# Patient Record
Sex: Male | Born: 1965 | Race: White | Hispanic: No | Marital: Married | Smoking: Current every day smoker
Health system: Southern US, Community
[De-identification: ages and names within clinical notes are randomized; demographics above are authoritative.]

## PROBLEM LIST (undated history)

## (undated) DIAGNOSIS — J449 Chronic obstructive pulmonary disease, unspecified: Secondary | ICD-10-CM

## (undated) DIAGNOSIS — I10 Essential (primary) hypertension: Secondary | ICD-10-CM

## (undated) HISTORY — PX: APPENDECTOMY: SHX54

## (undated) HISTORY — PX: OTHER SURGICAL HISTORY: SHX169

## (undated) HISTORY — PX: CARPAL TUNNEL RELEASE: SHX101

---

## 2012-06-26 ENCOUNTER — Encounter (HOSPITAL_COMMUNITY): Payer: Self-pay | Admitting: Emergency Medicine

## 2012-06-26 ENCOUNTER — Other Ambulatory Visit: Payer: Self-pay

## 2012-06-26 ENCOUNTER — Emergency Department (HOSPITAL_COMMUNITY)
Admission: EM | Admit: 2012-06-26 | Discharge: 2012-06-26 | Disposition: A | Discharge status: Home / Self Care | Payer: BC Managed Care – PPO | Attending: Emergency Medicine | Admitting: Emergency Medicine

## 2012-06-26 ENCOUNTER — Emergency Department (HOSPITAL_COMMUNITY): Payer: BC Managed Care – PPO

## 2012-06-26 DIAGNOSIS — R079 Chest pain, unspecified: Secondary | ICD-10-CM

## 2012-06-26 DIAGNOSIS — I10 Essential (primary) hypertension: Secondary | ICD-10-CM | POA: Insufficient documentation

## 2012-06-26 DIAGNOSIS — R61 Generalized hyperhidrosis: Secondary | ICD-10-CM | POA: Insufficient documentation

## 2012-06-26 DIAGNOSIS — R197 Diarrhea, unspecified: Secondary | ICD-10-CM | POA: Insufficient documentation

## 2012-06-26 DIAGNOSIS — R259 Unspecified abnormal involuntary movements: Secondary | ICD-10-CM | POA: Insufficient documentation

## 2012-06-26 DIAGNOSIS — Z79899 Other long term (current) drug therapy: Secondary | ICD-10-CM | POA: Insufficient documentation

## 2012-06-26 DIAGNOSIS — R110 Nausea: Secondary | ICD-10-CM | POA: Insufficient documentation

## 2012-06-26 DIAGNOSIS — Z7982 Long term (current) use of aspirin: Secondary | ICD-10-CM | POA: Insufficient documentation

## 2012-06-26 DIAGNOSIS — J4489 Other specified chronic obstructive pulmonary disease: Secondary | ICD-10-CM | POA: Insufficient documentation

## 2012-06-26 DIAGNOSIS — J449 Chronic obstructive pulmonary disease, unspecified: Secondary | ICD-10-CM | POA: Insufficient documentation

## 2012-06-26 DIAGNOSIS — F17200 Nicotine dependence, unspecified, uncomplicated: Secondary | ICD-10-CM | POA: Insufficient documentation

## 2012-06-26 DIAGNOSIS — R062 Wheezing: Secondary | ICD-10-CM | POA: Insufficient documentation

## 2012-06-26 HISTORY — DX: Chronic obstructive pulmonary disease, unspecified: J44.9

## 2012-06-26 HISTORY — DX: Essential (primary) hypertension: I10

## 2012-06-26 LAB — CBC WITH DIFFERENTIAL/PLATELET
Blasts: 0 %
Eosinophils Absolute: 0 10*3/uL (ref 0.0–0.7)
Eosinophils Relative: 0 % (ref 0–5)
MCH: 32.8 pg (ref 26.0–34.0)
MCV: 94.2 fL (ref 78.0–100.0)
Metamyelocytes Relative: 0 %
Myelocytes: 0 %
Neutro Abs: 6.8 10*3/uL (ref 1.7–7.7)
Neutrophils Relative %: 57 % (ref 43–77)
Platelets: 270 10*3/uL (ref 150–400)
Promyelocytes Absolute: 0 %
RBC: 4.69 MIL/uL (ref 4.22–5.81)
RDW: 13.6 % (ref 11.5–15.5)
nRBC: 0 /100 WBC

## 2012-06-26 LAB — BASIC METABOLIC PANEL
BUN: 17 mg/dL (ref 6–23)
CO2: 27 mEq/L (ref 19–32)
Chloride: 98 mEq/L (ref 96–112)
Creatinine, Ser: 0.98 mg/dL (ref 0.50–1.35)
GFR calc Af Amer: >90 mL/min (ref >90)
Glucose, Bld: 123 mg/dL — ABNORMAL HIGH (ref 70–99)
Potassium: 3.9 mEq/L (ref 3.5–5.1)

## 2012-06-26 MED ORDER — SODIUM CHLORIDE 0.9 % IV SOLN: Freq: Once | INTRAVENOUS | Status: DC

## 2012-06-26 MED ORDER — ONDANSETRON HCL 4 MG/2ML IJ SOLN
4.0000 mg | Freq: Once | INTRAMUSCULAR | Status: AC
  Administered 2012-06-26: 4 mg via INTRAVENOUS
  Filled 2012-06-26: qty 2

## 2012-06-26 MED ORDER — SODIUM CHLORIDE 0.9 % IV BOLUS (SEPSIS)
1000.0000 mL | Freq: Once | INTRAVENOUS | Status: AC
  Administered 2012-06-26: 1000 mL via INTRAVENOUS

## 2012-06-26 MED ORDER — ASPIRIN 81 MG PO CHEW
324.0000 mg | CHEWABLE_TABLET | Freq: Once | ORAL | Status: AC
  Administered 2012-06-26: 324 mg via ORAL
  Filled 2012-06-26: qty 4

## 2012-06-26 MED ORDER — ALBUTEROL SULFATE (5 MG/ML) 0.5% IN NEBU
2.5000 mg | INHALATION_SOLUTION | Freq: Once | RESPIRATORY_TRACT | Status: AC
  Administered 2012-06-26: 2.5 mg via RESPIRATORY_TRACT
  Filled 2012-06-26: qty 0.5

## 2012-06-26 MED ORDER — IPRATROPIUM BROMIDE 0.02 % IN SOLN
0.5000 mg | Freq: Once | RESPIRATORY_TRACT | Status: AC
  Administered 2012-06-26: 0.5 mg via RESPIRATORY_TRACT
  Filled 2012-06-26: qty 2.5

## 2012-06-26 MED ORDER — MORPHINE SULFATE 4 MG/ML IJ SOLN
2.0000 mg | Freq: Once | INTRAMUSCULAR | Status: AC
  Administered 2012-06-26: 2 mg via INTRAVENOUS
  Filled 2012-06-26: qty 1

## 2012-06-26 NOTE — ED Provider Notes (Signed)
History     CSN: [id number]  Arrival date & time 06/26/12  0150   First MD Initiated Contact with Patient 06/26/12 0157      Chief Complaint  Patient presents with  . Chest Pain    (Consider location/radiation/quality/duration/timing/severity/associated sxs/prior treatment) HPI Jaime Edwards is a 47 y.o. male who presents to the Emergency Department complaining of chest pain that began around 930 AM yesterday. [place] in his chest that required he allow someone else to drive. He managed to have enough pain and pressure he passed out. (He passes out to pain) Pain resolved on its own. Developed diarrhea throughout the day. Felt tired today. Ate supper (pork chop, three bean salad, collard salad) and went to bed around 930 PM. Woke three times tonight with similar pain that he experienced yesterday morning. Sharp stabbing pain in the chest associated with sweating and shaking.Denies radiation of pain, nausea, vomiting. He was hospitalized in October 2013 for similar pain at [facility] Cardiac work up was negative at that time.    Past Medical History  Diagnosis Date  . COPD (chronic obstructive pulmonary disease)   . Hypertension     Past Surgical History  Procedure Laterality Date  . Carpal tunnel release    . Appendectomy    . Hip surgeries    . Septum plasty      History reviewed. No pertinent family history.  History  Substance Use Topics  . Smoking status: Current Every Day Smoker  . Smokeless tobacco: Not on file  . Alcohol Use: Yes      Review of Systems  Constitutional: Negative for fever.       10 Systems reviewed and are negative for acute change except as noted in the HPI.  HENT: Negative for congestion.   Eyes: Negative for discharge and redness.  Respiratory: Negative for cough and shortness of breath.   Cardiovascular: Positive for chest pain.  Gastrointestinal: Negative for vomiting and abdominal pain.   Musculoskeletal: Negative for back pain.  Skin: Negative for rash.  Neurological: Negative for syncope, numbness and headaches.  Psychiatric/Behavioral:       No behavior change.    Allergies  Nsaids  Home Medications   Current Outpatient Rx  Name  Route  Sig  Dispense  Refill  . aspirin 81 MG tablet   Oral   Take 81 mg by mouth daily.         Marland Kitchen HYDROcodone-acetaminophen (NORCO/VICODIN) 5-325 MG per tablet   Oral   Take 1 tablet by mouth every 6 (six) hours as needed for pain.         Marland Kitchen lisinopril (PRINIVIL,ZESTRIL) 20 MG tablet   Oral   Take 20 mg by mouth daily.           BP 137/93  Pulse 87  Temp(Src) 98.7 F (37.1 C) (Oral)  Resp 24  Ht 6\' 1"  (1.854 m)  Wt 220 lb (99.791 kg)  BMI 29.03 kg/m2  SpO2 94%  Physical Exam  Nursing note and vitals reviewed. Constitutional: He appears well-developed and well-nourished.  Awake, alert, nontoxic appearance.  HENT:  Head: Normocephalic and atraumatic.  Right Ear: External ear normal.  Left Ear: External ear normal.  Mouth/Throat: Oropharynx is clear and moist.  Eyes: EOM are normal. Pupils are equal, round, and reactive to light.  Neck: Neck supple.  Cardiovascular: Normal rate and intact distal pulses.   Pulmonary/Chest: Effort normal. He has wheezes. He  exhibits no tenderness.  Abdominal: Soft. Bowel sounds are normal. There is no tenderness. There is no rebound.  Musculoskeletal: He exhibits no tenderness.  Baseline ROM, no obvious new focal weakness.  Neurological:  Mental status and motor strength appears baseline for patient and situation.  Skin: No rash noted.  Psychiatric: He has a normal mood and affect.    ED Course  Procedures (including critical care time) Results for orders placed during the hospital encounter of 06/26/12  CBC WITH DIFFERENTIAL      Result Value Range   WBC 12.0 (*) 4.0 - 10.5 K/uL   RBC 4.69  4.22 - 5.81 MIL/uL   Hemoglobin 15.4  13.0 - 17.0 g/dL   HCT 21.3  08.6 -  57.8 %   MCV 94.2  78.0 - 100.0 fL   MCH 32.8  26.0 - 34.0 pg   MCHC 34.8  30.0 - 36.0 g/dL   RDW 46.9  62.9 - 52.8 %   Platelets 270  150 - 400 K/uL   Neutrophils Relative 57  43 - 77 %   Lymphocytes Relative 35  12 - 46 %   Monocytes Relative 8  3 - 12 %   Eosinophils Relative 0  0 - 5 %   Basophils Relative 0  0 - 1 %   Band Neutrophils 0  0 - 10 %   Metamyelocytes Relative 0     Myelocytes 0     Promyelocytes Absolute 0     Blasts 0     nRBC 0  0 /100 WBC   Neutro Abs 6.8  1.7 - 7.7 K/uL   Lymphs Abs 4.2 (*) 0.7 - 4.0 K/uL   Monocytes Absolute 1.0  0.1 - 1.0 K/uL   Eosinophils Absolute 0.0  0.0 - 0.7 K/uL   Basophils Absolute 0.0  0.0 - 0.1 K/uL  BASIC METABOLIC PANEL      Result Value Range   Sodium 135  135 - 145 mEq/L   Potassium 3.9  3.5 - 5.1 mEq/L   Chloride 98  96 - 112 mEq/L   CO2 27  19 - 32 mEq/L   Glucose, Bld 123 (*) 70 - 99 mg/dL   BUN 17  6 - 23 mg/dL   Creatinine, Ser 4.13  0.50 - 1.35 mg/dL   Calcium 9.6  8.4 - 24.4 mg/dL   GFR calc non Af Amer >90  >90 mL/min   GFR calc Af Amer >90  >90 mL/min  TROPONIN I      Result Value Range   Troponin I <0.30  <0.30 ng/mL     Date: 06/26/2012   0157  Rate: 85  Rhythm: normal sinus rhythm  QRS Axis: normal  Intervals: normal  ST/T Wave abnormalities: normal  Conduction Disutrbances: none  Narrative Interpretation: unremarkable   Dg Chest Port 1 View  06/26/2012  *RADIOLOGY REPORT*  Clinical Data: Chest pain, COPD.  PORTABLE CHEST - 1 VIEW  Comparison: None.  Findings: Mild interstitial coarsening, likely chronic.  No confluent airspace opacity.  No pleural effusion or pneumothorax. Cardiomediastinal contours within normal range. Mild aortic atherosclerosis.  No acute osseous finding.  IMPRESSION: Mild interstitial prominence, likely chronic given the provided history of COPD.  No focal consolidation.   Original Report Authenticated By: Jearld Lesch, M.D.    Medications  sodium chloride 0.9 % bolus  1,000 mL (1,000 mLs Intravenous New Bag/Given 06/26/12 0233)  0.9 %  sodium chloride infusion (not administered)  albuterol (PROVENTIL) (5  MG/ML) 0.5% nebulizer solution 2.5 mg (2.5 mg Nebulization Given 06/26/12 0239)  ipratropium (ATROVENT) nebulizer solution 0.5 mg (0.5 mg Nebulization Given 06/26/12 0238)  morphine 4 MG/ML injection 2 mg (2 mg Intravenous Given 06/26/12 0233)  ondansetron (ZOFRAN) injection 4 mg (4 mg Intravenous Given 06/26/12 0233)  aspirin chewable tablet 324 mg (324 mg Oral Given 06/26/12 0233)    MDM  Patient here with recurrent chest pain associated with sweating and nausea. Troponin is negative. Labs are unremarkable. EKG and Chest xray are normal. Patient was given morphine which resolved the pain. Reviewed the results with patient and his wife. Pt stable in ED with no significant deterioration in condition.The patient appears reasonably screened and/or stabilized for discharge and I doubt any other medical condition or other [facility] requiring further screening, evaluation, or treatment in the ED at this time prior to discharge.  MDM Reviewed: nursing note and vitals Interpretation: labs, ECG and x-ray           Nicoletta Dress. Colon Branch, MD 06/26/12 1610

## 2012-06-26 NOTE — ED Notes (Signed)
Pt c/o chest pain that started around 9:30am yesterday and pt states he passed out.. Pt states he began to feel better. Pt statates around 10pm tonight he had chest pain that kept waking him up, also states he was shaking and sweating.

## 2014-07-11 IMAGING — CR DG CHEST 1V PORT
1 series · 1 of 1 positions shown · non-contrast
Comparison: None.

CLINICAL DATA: Chest pain, COPD.

PORTABLE CHEST - 1 VIEW

[view not recorded]
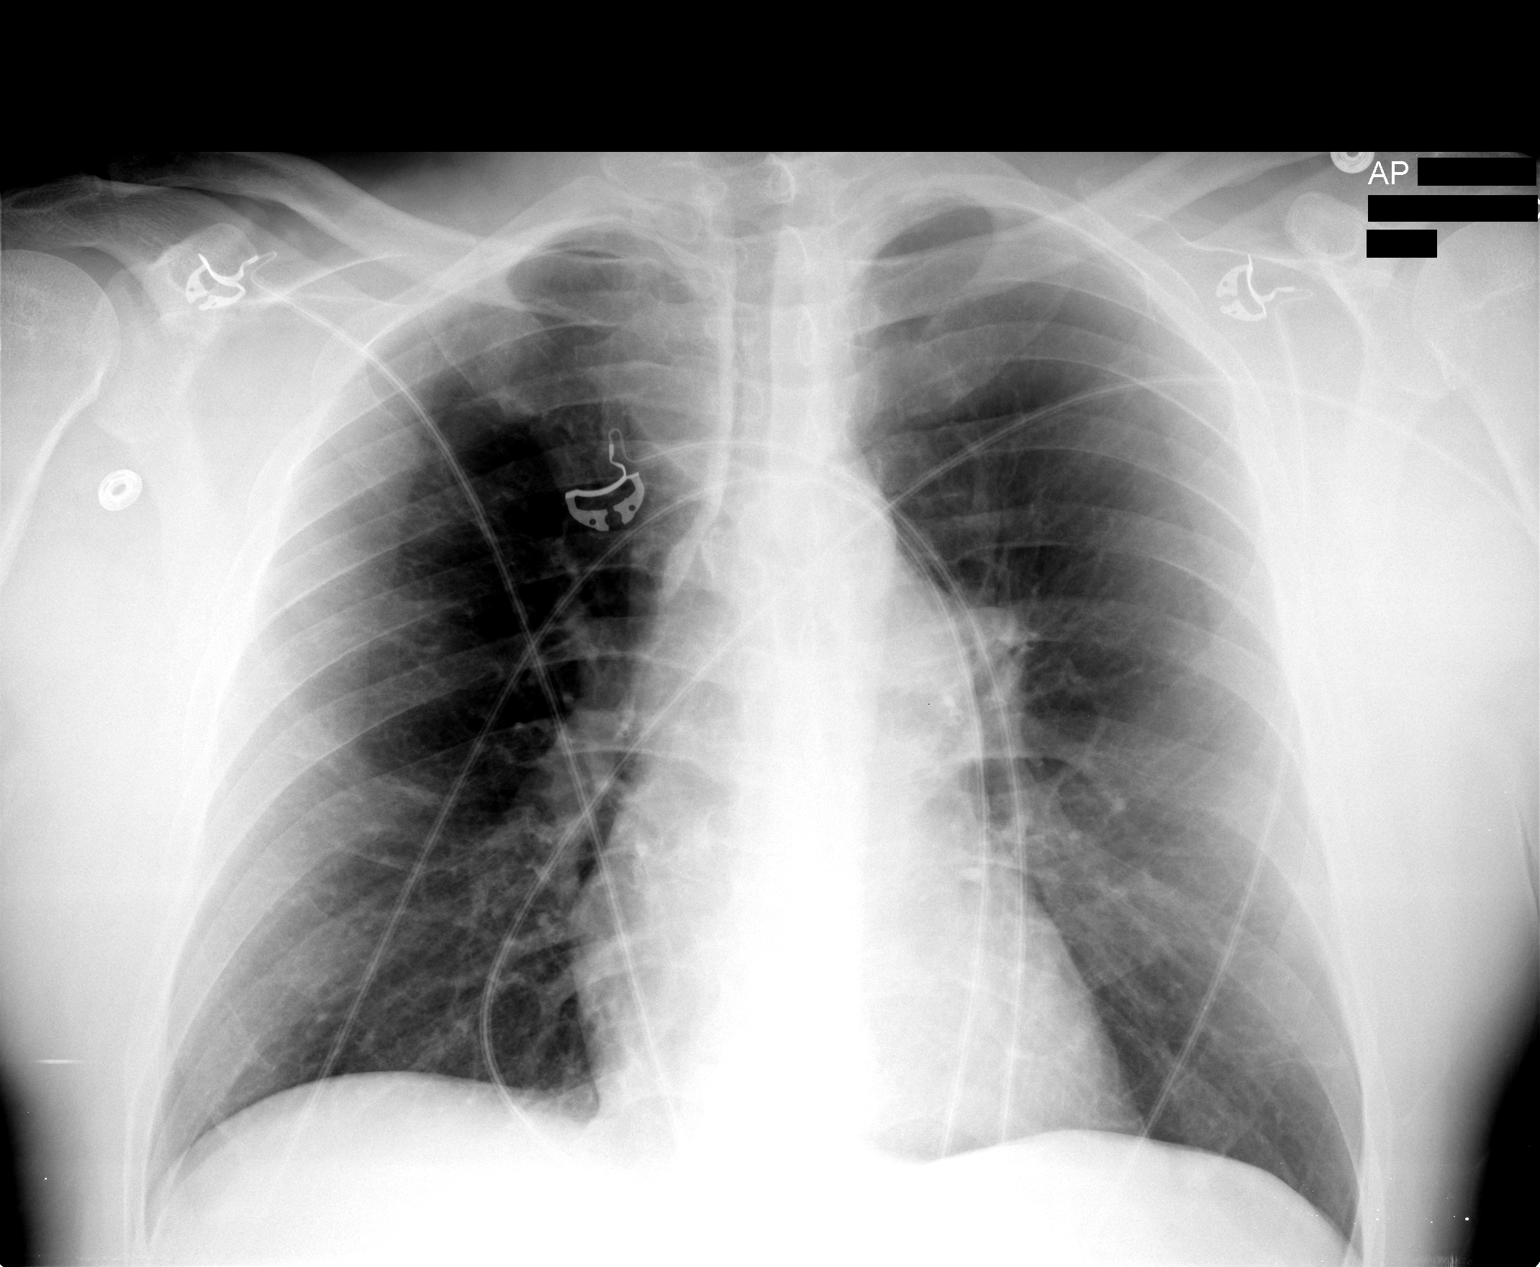

[1 of 1 positions shown; findings below may reference images not displayed]

FINDINGS: Mild interstitial coarsening, likely chronic.  No
confluent airspace opacity.  No pleural effusion or pneumothorax.
Cardiomediastinal contours within normal range. Mild aortic
atherosclerosis.  No acute osseous finding.
IMPRESSION: Mild interstitial prominence, likely chronic given the provided
history of COPD.  No focal consolidation.

## 2017-02-19 ENCOUNTER — Emergency Department (HOSPITAL_COMMUNITY)
Admission: EM | Admit: 2017-02-19 | Discharge: 2017-02-19 | Disposition: A | Discharge status: Home / Self Care | Payer: BLUE CROSS/BLUE SHIELD | Attending: Emergency Medicine | Admitting: Emergency Medicine

## 2017-02-19 ENCOUNTER — Emergency Department (HOSPITAL_COMMUNITY): Payer: BLUE CROSS/BLUE SHIELD

## 2017-02-19 ENCOUNTER — Encounter (HOSPITAL_COMMUNITY): Payer: Self-pay | Admitting: Emergency Medicine

## 2017-02-19 DIAGNOSIS — Z79899 Other long term (current) drug therapy: Secondary | ICD-10-CM | POA: Insufficient documentation

## 2017-02-19 DIAGNOSIS — J449 Chronic obstructive pulmonary disease, unspecified: Secondary | ICD-10-CM | POA: Diagnosis not present

## 2017-02-19 DIAGNOSIS — R0602 Shortness of breath: Secondary | ICD-10-CM | POA: Diagnosis present

## 2017-02-19 DIAGNOSIS — J40 Bronchitis, not specified as acute or chronic: Secondary | ICD-10-CM | POA: Insufficient documentation

## 2017-02-19 DIAGNOSIS — I10 Essential (primary) hypertension: Secondary | ICD-10-CM | POA: Diagnosis not present

## 2017-02-19 DIAGNOSIS — Z7982 Long term (current) use of aspirin: Secondary | ICD-10-CM | POA: Insufficient documentation

## 2017-02-19 DIAGNOSIS — F17210 Nicotine dependence, cigarettes, uncomplicated: Secondary | ICD-10-CM | POA: Insufficient documentation

## 2017-02-19 LAB — BASIC METABOLIC PANEL
ANION GAP: 7 (ref 5–15)
BUN: 14 mg/dL (ref 6–20)
CO2: 28 mmol/L (ref 22–32)
Calcium: 8.8 mg/dL — ABNORMAL LOW (ref 8.9–10.3)
Chloride: 102 mmol/L (ref 101–111)
Creatinine, Ser: 0.95 mg/dL (ref 0.61–1.24)
GFR calc Af Amer: >60 mL/min (ref >60)
GLUCOSE: 99 mg/dL (ref 65–99)
Potassium: 4.1 mmol/L (ref 3.5–5.1)
SODIUM: 137 mmol/L (ref 135–145)

## 2017-02-19 LAB — HEPATIC FUNCTION PANEL
ALBUMIN: 3.8 g/dL (ref 3.5–5.0)
ALT: 36 U/L (ref 17–63)
AST: 35 U/L (ref 15–41)
Alkaline Phosphatase: 98 U/L (ref 38–126)
BILIRUBIN TOTAL: 1 mg/dL (ref 0.3–1.2)
Bilirubin, Direct: 0.4 mg/dL (ref 0.1–0.5)
Indirect Bilirubin: 0.6 mg/dL (ref 0.3–0.9)
Total Protein: 7.2 g/dL (ref 6.5–8.1)

## 2017-02-19 LAB — CBC WITH DIFFERENTIAL/PLATELET
BASOS ABS: 0 10*3/uL (ref 0.0–0.1)
Basophils Relative: 0 %
Eosinophils Absolute: 0 10*3/uL (ref 0.0–0.7)
Eosinophils Relative: 0 %
HEMATOCRIT: 44.3 % (ref 39.0–52.0)
Hemoglobin: 14.4 g/dL (ref 13.0–17.0)
Lymphocytes Relative: 15 %
Lymphs Abs: 1.2 10*3/uL (ref 0.7–4.0)
MCH: 32.4 pg (ref 26.0–34.0)
MCHC: 32.5 g/dL (ref 30.0–36.0)
MCV: 99.8 fL (ref 78.0–100.0)
MONO ABS: 0.6 10*3/uL (ref 0.1–1.0)
Monocytes Relative: 7 %
NEUTROS ABS: 6.2 10*3/uL (ref 1.7–7.7)
Neutrophils Relative %: 78 %
PLATELETS: 198 10*3/uL (ref 150–400)
RBC: 4.44 MIL/uL (ref 4.22–5.81)
RDW: 13.2 % (ref 11.5–15.5)
WBC: 8 10*3/uL (ref 4.0–10.5)

## 2017-02-19 MED ORDER — DOXYCYCLINE HYCLATE 100 MG PO TABS
100.0000 mg | ORAL_TABLET | Freq: Once | ORAL | Status: AC
  Administered 2017-02-19: 100 mg via ORAL
  Filled 2017-02-19: qty 1

## 2017-02-19 MED ORDER — ALBUTEROL SULFATE HFA 108 (90 BASE) MCG/ACT IN AERS
2.0000 | INHALATION_SPRAY | RESPIRATORY_TRACT | Status: DC | PRN
  Administered 2017-02-19: 2 via RESPIRATORY_TRACT
  Filled 2017-02-19: qty 6.7

## 2017-02-19 MED ORDER — SODIUM CHLORIDE 0.9 % IV BOLUS (SEPSIS)
1000.0000 mL | Freq: Once | INTRAVENOUS | Status: AC
  Administered 2017-02-19: 1000 mL via INTRAVENOUS

## 2017-02-19 MED ORDER — DOXYCYCLINE HYCLATE 100 MG PO CAPS: 100.0000 mg | ORAL_CAPSULE | Freq: Two times a day (BID) | ORAL | 0 refills | Status: AC

## 2017-02-19 MED ORDER — ACETAMINOPHEN 500 MG PO TABS
1000.0000 mg | ORAL_TABLET | Freq: Once | ORAL | Status: AC
  Administered 2017-02-19: 1000 mg via ORAL
  Filled 2017-02-19: qty 2

## 2017-02-19 NOTE — ED Triage Notes (Signed)
Pt having CP since Monday, SOB since today. Had a fever of 103 at work today and was seen at [facility]. Flu and chest x-ray were negative. Was given a breathing tx with no improvement. Last Tylenol dose was at 1600. Dry cough noted.

## 2017-02-19 NOTE — ED Provider Notes (Signed)
[facility] EMERGENCY DEPARTMENT Provider Note   CSN: [id number] Arrival date & time: 02/19/17  1841     History   Chief Complaint Chief Complaint  Patient presents with  . Shortness of Breath    HPI Jaime Edwards is a 51 y.o. male.  Patient complains of cough congestion shortness of breath fever chills.   The history is provided by the patient.  Shortness of Breath  This is a new problem. The problem occurs intermittently.The problem has not changed since onset.Associated symptoms include a fever. Pertinent negatives include no headaches, no cough, no chest pain, no abdominal pain and no rash. It is unknown what precipitated the problem. He has tried nothing for the symptoms. The treatment provided no relief.    Past Medical History:  Diagnosis Date  . COPD (chronic obstructive pulmonary disease) (HCC)   . Hypertension     There are no active problems to display for this patient.   Past Surgical History:  Procedure Laterality Date  . APPENDECTOMY    . CARPAL TUNNEL RELEASE    . hip surgeries    . septum plasty         Home Medications    Prior to Admission medications   Medication Sig Start Date End Date Taking? Authorizing Provider  amitriptyline (ELAVIL) 25 MG tablet Take 1 tablet by mouth every evening. 05/17/16  Yes [provider]  atenolol (TENORMIN) 25 MG tablet Take 1 tablet by mouth daily. 05/17/16 05/17/17 Yes [provider]  gabapentin (NEURONTIN) 100 MG capsule Take 3 capsules by mouth at bedtime. 05/17/16  Yes [provider]  omeprazole (PRILOSEC) 40 MG capsule Take 1 capsule by mouth daily. 02/15/17 03/17/17 Yes [provider]  aspirin 81 MG tablet Take 81 mg by mouth daily.    [provider]  doxycycline (VIBRAMYCIN) 100 MG capsule Take 1 capsule (100 mg total) by mouth 2 (two) times daily. One po bid x 7 days 02/19/17   Bethann BerkshireZammit, Rody Keadle, MD  HYDROcodone-acetaminophen (NORCO/VICODIN) 5-325 MG per  tablet Take 1 tablet by mouth every 6 (six) hours as needed for pain.    [provider]  lisinopril (PRINIVIL,ZESTRIL) 20 MG tablet Take 20 mg by mouth daily.    [provider]  Multiple Vitamin (MULTI-VITAMINS) TABS Take 1 tablet by mouth daily.    [provider]  Omega-3 Fatty Acids (FISH OIL PO) Take 1 capsule by mouth daily.    [provider]    Family History No family history on file.  Social History Social History   Tobacco Use  . Smoking status: Current Every Day Smoker    Packs/day: 2.00  . Smokeless tobacco: Current User    Types: Chew, Snuff  Substance Use Topics  . Alcohol use: Yes  . Drug use: Yes    Types: Marijuana    Comment: used approx 02/16/17     Allergies   Nsaids   Review of Systems Review of Systems  Constitutional: Positive for fever. Negative for appetite change and fatigue.  HENT: Negative for congestion, ear discharge and sinus pressure.   Eyes: Negative for discharge.  Respiratory: Positive for shortness of breath. Negative for cough.   Cardiovascular: Negative for chest pain.  Gastrointestinal: Negative for abdominal pain and diarrhea.  Genitourinary: Negative for frequency and hematuria.  Musculoskeletal: Negative for back pain.  Skin: Negative for rash.  Neurological: Negative for seizures and headaches.  Psychiatric/Behavioral: Negative for hallucinations.     Physical Exam Updated Vital Signs  BP 134/88   Pulse (!) 110   Temp (!) 103.4 F (39.7 C) (Oral)   Resp (!) 21   Ht 6\' 1"  (1.854 m)   Wt 101.2 kg (223 lb)   SpO2 95%   BMI 29.42 kg/m   Physical Exam  Constitutional: He is oriented to person, place, and time. He appears well-developed.  HENT:  Head: Normocephalic.  Eyes: Conjunctivae and EOM are normal. No scleral icterus.  Neck: Neck supple. No thyromegaly present.  Cardiovascular: Normal rate and regular rhythm. Exam reveals no gallop and no friction rub.  No murmur  heard. Pulmonary/Chest: No stridor. He has wheezes. He has no rales. He exhibits no tenderness.  Abdominal: He exhibits no distension. There is no tenderness. There is no rebound.  Musculoskeletal: Normal range of motion. He exhibits no edema.  Lymphadenopathy:    He has no cervical adenopathy.  Neurological: He is oriented to person, place, and time. He exhibits normal muscle tone. Coordination normal.  Skin: No rash noted. No erythema.  Psychiatric: He has a normal mood and affect. His behavior is normal.     ED Treatments / Results  Labs (all labs ordered are listed, but only abnormal results are displayed) Labs Reviewed  BASIC METABOLIC PANEL - Abnormal; Notable for the following components:      Result Value   Calcium 8.8 (*)    All other components within normal limits  CBC WITH DIFFERENTIAL/PLATELET  HEPATIC FUNCTION PANEL    EKG  EKG Interpretation  Date/Time:  Saturday February 19 2017 18:48:52 EST Ventricular Rate:  112 PR Interval:    QRS Duration: 87 QT Interval:  320 QTC Calculation: 437 R Axis:   103 Text Interpretation:  Sinus tachycardia Right axis deviation Baseline wander in lead(s) V4 V5 V6 Confirmed by Bethann Berkshire [phone]) on 02/19/2017 8:34:48 PM       Radiology Dg Chest Portable 1 View  Result Date: 02/19/2017 CLINICAL DATA:  Shortness of breath.  Cough and fever. EXAM: PORTABLE CHEST 1 VIEW COMPARISON:  June 26, 2012 FINDINGS: The heart size is borderline. The hila and mediastinum are unchanged since 2014. Mild interstitial prominence without overt edema. No nodule, mass, or focal infiltrate. IMPRESSION: Mild interstitial prominence could be due to pulmonary venous congestion, bronchitic change, or atypical infection. No focal infiltrate. Electronically Signed   By: Gerome Sam III M.D   On: 02/19/2017 19:21    Procedures Procedures (including critical care time)  Medications Ordered in ED Medications  albuterol (PROVENTIL HFA;VENTOLIN  HFA) 108 (90 Base) MCG/ACT inhaler 2 puff (not administered)  sodium chloride 0.9 % bolus 1,000 mL (1,000 mLs Intravenous New Bag/Given 02/19/17 1954)  acetaminophen (TYLENOL) tablet 1,000 mg (1,000 mg Oral Given 02/19/17 2011)  doxycycline (VIBRA-TABS) tablet 100 mg (100 mg Oral Given 02/19/17 2011)     Initial Impression / Assessment and Plan / ED Course  I have reviewed the triage vital signs and the nursing notes.  Pertinent labs & imaging results that were available during my care of the patient were reviewed by me and considered in my medical decision making (see chart for details).     Patient with atypical infection in his lungs.  He will be placed on doxycycline and also given an inhaler and told to drink plenty of fluids and take Tylenol follow-up with his PCP in a couple days  Final Clinical Impressions(s) / ED Diagnoses   Final diagnoses:  Bronchitis    ED Discharge Orders  Ordered    doxycycline (VIBRAMYCIN) 100 MG capsule  2 times daily     02/19/17 2035       Bethann BerkshireZammit, Abas Leicht, MD 02/19/17 2038

## 2017-02-19 NOTE — Discharge Instructions (Signed)
Drink plenty of fluids and take Tylenol every 4 hours for fevers and aches.  Follow-up with your family doctor in a couple days for recheck

## 2019-03-06 IMAGING — CR DG CHEST 1V PORT
1 series · 1 of 1 positions shown · non-contrast
Comparison: June 26, 2012

CLINICAL DATA: Shortness of breath.  Cough and fever.

EXAM:
PORTABLE CHEST 1 VIEW

[portable]
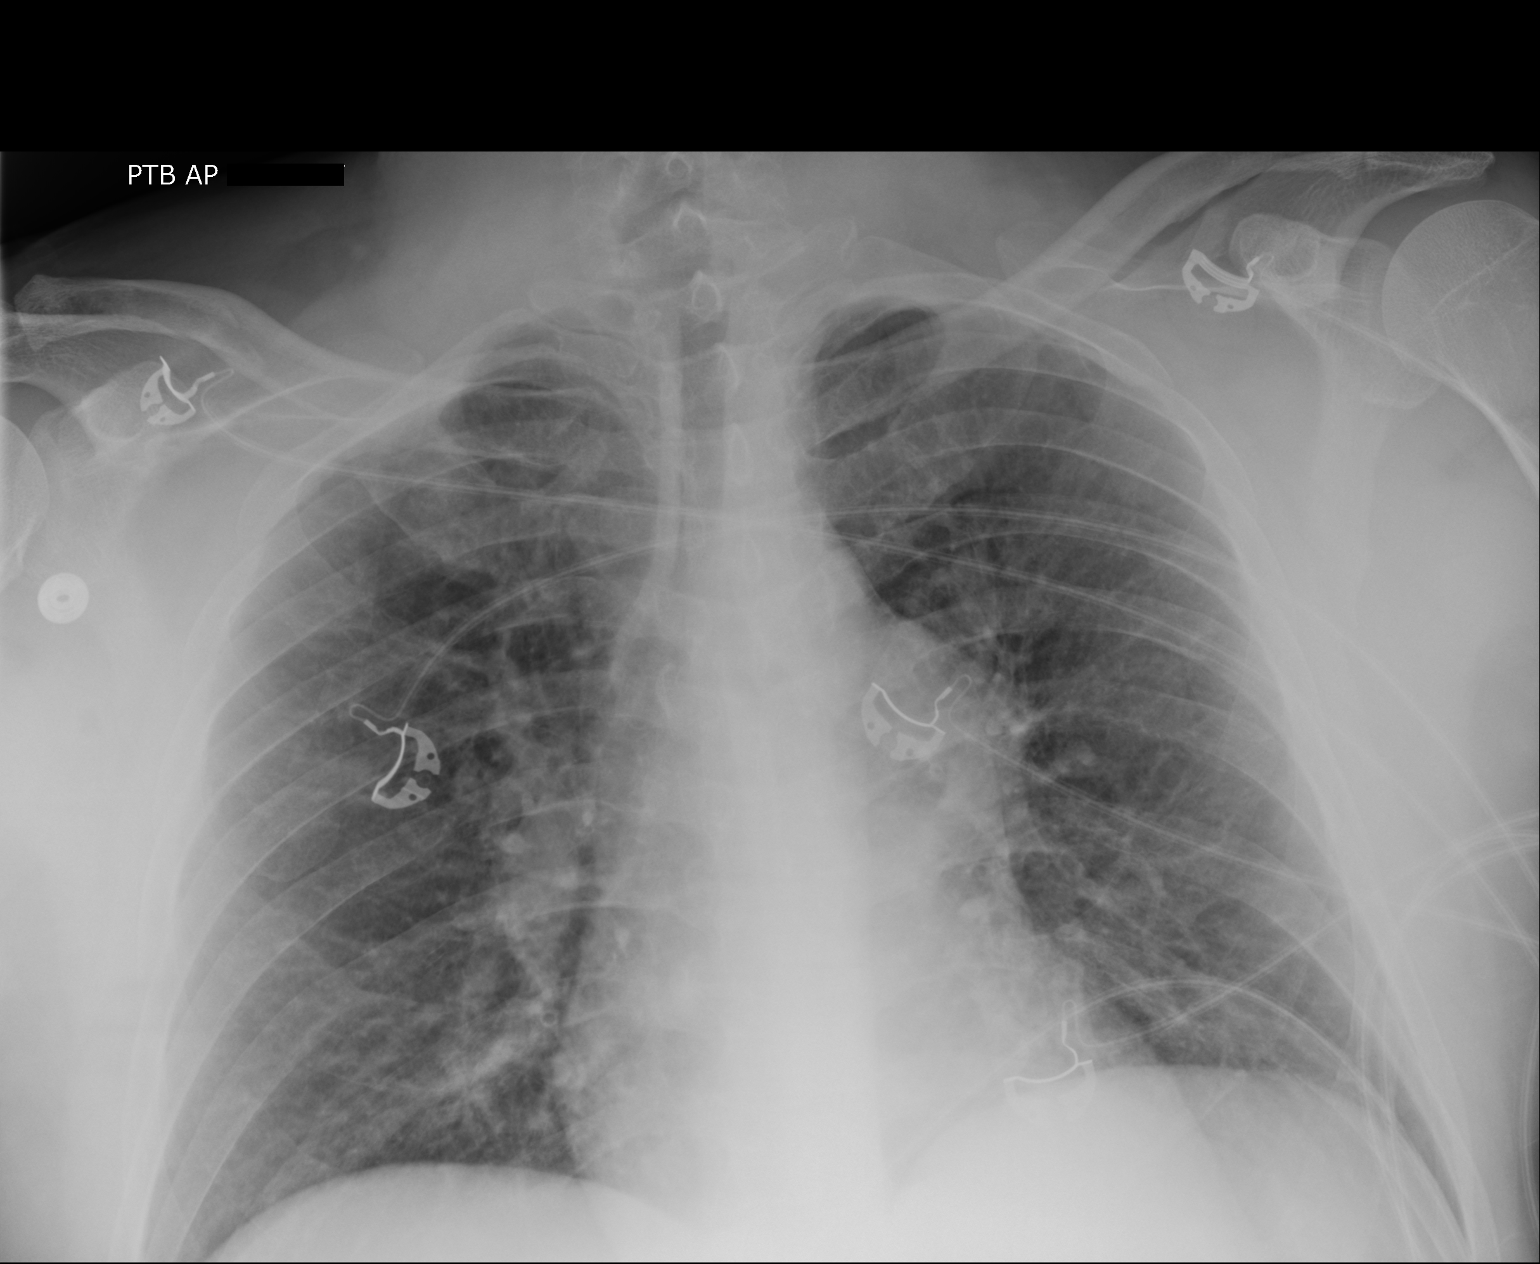

[1 of 1 positions shown; findings below may reference images not displayed]

FINDINGS: The heart size is borderline. The hila and mediastinum are unchanged
since 2906. Mild interstitial prominence without overt edema. No
nodule, mass, or focal infiltrate.
IMPRESSION: Mild interstitial prominence could be due to pulmonary venous
congestion, bronchitic change, or atypical infection. No focal
infiltrate.
